# Patient Record
Sex: Male | Born: 1995 | Race: Black or African American | Hispanic: No | Marital: Single | Smoking: Never smoker
Health system: Southern US, Community
[De-identification: ages and names within clinical notes are randomized; demographics above are authoritative.]

## PROBLEM LIST (undated history)

## (undated) DIAGNOSIS — D573 Sickle-cell trait: Secondary | ICD-10-CM

## (undated) HISTORY — PX: OTHER SURGICAL HISTORY: SHX169

## (undated) HISTORY — DX: Sickle-cell trait: D57.3

---
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
## 2018-05-02 ENCOUNTER — Encounter (HOSPITAL_COMMUNITY): Payer: Self-pay | Admitting: Emergency Medicine

## 2018-05-02 ENCOUNTER — Ambulatory Visit (HOSPITAL_COMMUNITY)
Admission: EM | Admit: 2018-05-02 | Discharge: 2018-05-02 | Disposition: A | Discharge status: Home / Self Care | Payer: BLUE CROSS/BLUE SHIELD | Attending: Family Medicine | Admitting: Family Medicine

## 2018-05-02 ENCOUNTER — Other Ambulatory Visit: Payer: Self-pay

## 2018-05-02 DIAGNOSIS — L731 Pseudofolliculitis barbae: Secondary | ICD-10-CM | POA: Diagnosis not present

## 2018-05-02 NOTE — ED Triage Notes (Signed)
Pt. Stated, My job requires me to take off my facial hair and if I do I have bumps, You have to sign the paper to say I do not have to shave my facial hair.

## 2018-05-03 NOTE — ED Provider Notes (Signed)
[facility]ARE [facility]ENTER   [id number] 05/02/18 Arrival Time: 1931  ASSESSMENT & PLAN:  1. Pseudofolliculitis barbae    Work form signed. He may f/u as needed.  Reviewed expectations re: course of current medical issues. Questions answered. Outlined signs and symptoms indicating need for more acute intervention. Patient verbalized understanding. After Visit Summary given.   SUBJECTIVE:  Scott Barnett is a 22 y.o. male who presents with a skin complaint.   Location: beard; jawline Onset: gradual Duration: several weeks to months Pruritic? No Painful? Yes Progression: stable  Drainage? No  Known trigger? Relates worse when he shaves his beard close New soaps/lotions/topicals/detergents? No Environmental exposures or allergies? none Contacts with similar? No Recent travel? No  Other associated symptoms: none Therapies tried thus far: none Denies fever. No specific aggravating or alleviating factors reported.  H/O this before.  Here because he needs a work note from [employer] signed stating that he has PSB and cannot shave his beard.  ROS: As per HPI.  OBJECTIVE: Vitals:   05/02/18 2001 05/02/18 2002  BP: 110/64   Pulse: 60   Resp: 16   Temp: 99 F (37.2 C)   TempSrc: Oral   SpO2: 98%   Weight:  179 lb 6 oz (81.4 kg)  Height:   (1.88 m)    General appearance: alert; no distress Lungs: clear to auscultation bilaterally Heart: regular rate and rhythm Extremities: no edema Skin: warm and dry; flesh-coloured and red follicular papules under bilateral jawline; no bleeding or drainage Psychological: alert and cooperative; normal mood and affect  No Known Allergies  Past Medical History:  Diagnosis Date  . Sickle cell trait (HCC)    Social History   Socioeconomic History  . Marital status: Single    Spouse name: Not on file  . Number of children: Not on file  . Years of education: Not on file  . Highest education level: Not on file  Occupational History    . Not on file  Social Needs  . Financial resource strain: Not on file  . Food insecurity:    Worry: Not on file    Inability: Not on file  . Transportation needs:    Medical: Not on file    Non-medical: Not on file  Tobacco Use  . Smoking status: Never Smoker  . Smokeless tobacco: Never Used  Substance and Sexual Activity  . Alcohol use: No  . Drug use: No  . Sexual activity: Never    Birth control/protection: Abstinence  Lifestyle  . Physical activity:    Days per week: Not on file    Minutes per session: Not on file  . Stress: Not on file  Relationships  . Social connections:    Talks on phone: Not on file    Gets together: Not on file    Attends religious service: Not on file    Active member of club or organization: Not on file    Attends meetings of clubs or organizations: Not on file    Relationship status: Not on file  . Intimate partner violence:    Fear of current or ex partner: Not on file    Emotionally abused: Not on file    Physically abused: Not on file    Forced sexual activity: Not on file  Other Topics Concern  . Not on file  Social History Narrative   Marital status: single; not dating currently.      Lives: with grandparents, aunt.  Mom in apartment; Dad  in [place].  Moved from [place] 6 months ago.     Education:  11th grader at [employer]  Adjusting fine.  ABCs in Math; failed Math; no held back.  Favorite subject English; wants to be unknown.  Writes songs; wants to play football.  Track and football and maybe Lacrosse.  Punishment: minimal punishment.  No attention or behavior issues.  No driving; no learner's permit; interested in driving; +seatbelt 161%.  Cell phone use; some texting issues.  Goes to bed 3:00am; wakes up 7:45.  Fun on weekends write songs, hand out with friends.     Family History  Problem Relation Age of Onset  . Cerebral palsy Sister   . Developmental delay Brother   . Hypertension Mother   . Migraines Mother     Past Surgical History:  Procedure Laterality Date  . Full term infant C-section 9-8 lb    . TYMPANOSTOMY TUBE PLACEMENT  1999     Mardella Layman, MD 05/07/18 [phone]

## 2021-04-12 ENCOUNTER — Ambulatory Visit: Payer: Self-pay

## 2021-04-12 ENCOUNTER — Other Ambulatory Visit: Payer: Self-pay

## 2021-04-12 ENCOUNTER — Ambulatory Visit (INDEPENDENT_AMBULATORY_CARE_PROVIDER_SITE_OTHER): Payer: BC Managed Care – PPO

## 2021-04-12 ENCOUNTER — Ambulatory Visit
Admission: EM | Admit: 2021-04-12 | Discharge: 2021-04-12 | Disposition: A | Discharge status: Home / Self Care | Payer: BC Managed Care – PPO | Attending: Emergency Medicine | Admitting: Emergency Medicine

## 2021-04-12 DIAGNOSIS — X58XXXA Exposure to other specified factors, initial encounter: Secondary | ICD-10-CM | POA: Diagnosis not present

## 2021-04-12 DIAGNOSIS — M79645 Pain in left finger(s): Secondary | ICD-10-CM

## 2021-04-12 DIAGNOSIS — M26621 Arthralgia of right temporomandibular joint: Secondary | ICD-10-CM

## 2021-04-12 DIAGNOSIS — S6992XA Unspecified injury of left wrist, hand and finger(s), initial encounter: Secondary | ICD-10-CM | POA: Diagnosis not present

## 2021-04-12 MED ORDER — NAPROXEN 500 MG PO TABS: 500.0000 mg | ORAL_TABLET | Freq: Two times a day (BID) | ORAL | 1 refills | Status: AC | PRN

## 2021-04-12 NOTE — Discharge Instructions (Signed)
Your x-ray was normal today.  You do not have any fractures.  You likely sprained or strained your thumb.  I have sent an anti-inflammatory to the pharmacy.  You can also take Tylenol if you need to for pain.  Ice your thumb every couple of hours to help with the swelling.  We have given you a brace.  Try to keep your hand elevated.  If you are still having significant discomfort or limited range of motion of the thumb in the next 7 to 10 days, follow-up with [facility] walk-in urgent care in [facility].  You may need a repeat x-ray.  Your jaw pain is likely related to TMJ.  See the handout.  Try to avoid excessive chewing or grinding.  You may consider seeing your dentist in case your TMJ pain is related to clenching her jaw or grinding her teeth.  The anti-inflammatory should help and you can also take over-the-counter Tylenol for pain relief.

## 2021-04-12 NOTE — ED Triage Notes (Signed)
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  Swelling noted at base of L thumb.  Can make fist but unable to grip.  Denies numbness /tingling.  Ice was applied at home.  No meds taken at home.   Pt also concerned with R jaw pain that occurs with extensive chewing.  First noticed 1.5 weeks ago, describes as a tightness and pain.

## 2021-04-12 NOTE — ED Provider Notes (Signed)
[facility]    CSN: [id number] Arrival date & time: 04/12/21  1448      History   Chief Complaint Chief Complaint  Patient presents with  . Hand Pain    L DOI 04/24    HPI Scott Barnett is a 25 y.o. right-handed male presenting for multiple complaints.  First he states that he accidentally jammed his left thumb on a router yesterday.  He has had swelling of his thumb and pain since.  He also has difficulty moving the thumb due to pain.  He denies any numbness or tingling or weakness.  He has not applied ice or taken any medications over-the-counter for pain relief.  Additionally, he complains of right-sided jaw pain that comes and goes for the past week and a half.  He says that pain is worse whenever he chews and sometimes never he opens his mouth widely.  He says that this "tight and painful."  He denies any other abdominal pain or ear pain.  No sore throat.  Denies any injury he has no other complaints or concerns today.  HPI  Past Medical History:  Diagnosis Date  . Sickle cell trait (HCC)     There are no problems to display for this patient.   Past Surgical History:  Procedure Laterality Date  . Full term infant C-section 9-8 lb    . TYMPANOSTOMY TUBE PLACEMENT  1999       Home Medications    Prior to Admission medications   Medication Sig Start Date End Date Taking? Authorizing Provider  naproxen (NAPROSYN) 500 MG tablet Take 1 tablet (500 mg total) by mouth 2 (two) times daily as needed for up to 15 days. 04/12/21 04/27/21 Yes Shirlee Latch, PA-C    Family History Family History  Problem Relation Age of Onset  . Cerebral palsy Sister   . Developmental delay Brother   . Hypertension Mother   . Migraines Mother     Social History Social History   Tobacco Use  . Smoking status: Never Smoker  . Smokeless tobacco: Never Used  Vaping Use  . Vaping Use: Never used  Substance Use Topics  . Alcohol use: Yes    Comment: occasional   . Drug  use: No     Allergies   Patient has no known allergies.   Review of Systems Review of Systems  Constitutional: Negative for fatigue and fever.  HENT: Negative for dental problem, ear pain and facial swelling.   Musculoskeletal: Positive for arthralgias and joint swelling. Negative for myalgias.  Skin: Negative for color change, rash and wound.  Neurological: Negative for weakness.     Physical Exam Triage Vital Signs ED Triage Vitals  Enc Vitals Group     BP 04/12/21 1526 116/78     Pulse Rate 04/12/21 1526 82     Resp 04/12/21 1526 18     Temp 04/12/21 1526 99 F (37.2 C)     Temp Source 04/12/21 1526 Oral     SpO2 04/12/21 1526 96 %     Weight --      Height --      Head Circumference --      Peak Flow --      Pain Score 04/12/21 1523 3     Pain Loc --      Pain Edu? --      Excl. in GC? --    No data found.  Updated Vital Signs BP 116/78 (BP Location:  Left Arm)   Pulse 82   Temp 99 F (37.2 C) (Oral)   Resp 18   SpO2 96%       Physical Exam Vitals and nursing note reviewed.  Constitutional:      General: He is not in acute distress.    Appearance: Normal appearance. He is well-developed. He is not ill-appearing.  HENT:     Head: Normocephalic and atraumatic.     Comments: Mild TTP TMJ on right. No clicking or popping    Right Ear: Tympanic membrane, ear canal and external ear normal.     Nose: Nose normal.     Mouth/Throat:     Mouth: Mucous membranes are moist.     Pharynx: Oropharynx is clear.     Comments: Normal dentition. No abscesses or tenderness Eyes:     General: No scleral icterus.    Conjunctiva/sclera: Conjunctivae normal.  Cardiovascular:     Rate and Rhythm: Normal rate and regular rhythm.     Pulses: Normal pulses.  Pulmonary:     Effort: Pulmonary effort is normal. No respiratory distress.     Breath sounds: Normal breath sounds.  Musculoskeletal:     Left hand: Bony tenderness (TTP base of thumb, IP joint. No snuff box  tenderness) present. Decreased range of motion (decreased abduction and aduction as well as flexion of left thumb). Normal strength. Normal sensation. Normal pulse.     Cervical back: Neck supple.  Skin:    General: Skin is warm and dry.  Neurological:     General: No focal deficit present.     Mental Status: He is alert. Mental status is at baseline.     Motor: No weakness.     Gait: Gait normal.  Psychiatric:        Mood and Affect: Mood normal.        Behavior: Behavior normal.        Thought Content: Thought content normal.      UC Treatments / Results  Labs (all labs ordered are listed, but only abnormal results are displayed) Labs Reviewed - No data to display  EKG   Radiology DG Hand Complete Left  Result Date: 04/12/2021 CLINICAL DATA:  Blunt trauma.  LEFT base of thumb. EXAM: LEFT HAND - COMPLETE 3+ VIEW COMPARISON:  None. FINDINGS: No evidence of fracture of the carpal or metacarpal bones. Radiocarpal joint is intact. Phalanges are normal. No soft tissue injury. IMPRESSION: No fracture or dislocation. Electronically Signed   By: Genevive Bi M.D.   On: 04/12/2021 16:06    Procedures Procedures (including critical care time)  Medications Ordered in UC Medications - No data to display  Initial Impression / Assessment and Plan / UC Course  I have reviewed the triage vital signs and the nursing notes.  Pertinent labs & imaging results that were available during my care of the patient were reviewed by me and considered in my medical decision making (see chart for details).   25 year old male presenting for left thumb injury and also TMJ pain.  X-ray of left thumb obtained today which does not reveal any fractures.  Independent interpretation reviewed by me.  Denies any fractures.  Reviewed results to patient.  Patient given thumb spica and advised on conservative care.  I sent naproxen to pharmacy and advised he can also take Tylenol and ice the finger.  Advised  to follow-up with Ortho if not improving over the next week or if swelling and pain worsen.  Advised  patient that his TMJ pain should also improve with the naproxen I have sent.  Advised him to avoid any excessive chewing.  Advised following up with PCP or dentist if this continues over the next couple of weeks or does not improve.   Final Clinical Impressions(s) / UC Diagnoses   Final diagnoses:  Thumb pain, left  Injury of left thumb, initial encounter  Arthralgia of right temporomandibular joint     Discharge Instructions     Your x-ray was normal today.  You do not have any fractures.  You likely sprained or strained your thumb.  I have sent an anti-inflammatory to the pharmacy.  You can also take Tylenol if you need to for pain.  Ice your thumb every couple of hours to help with the swelling.  We have given you a brace.  Try to keep your hand elevated.  If you are still having significant discomfort or limited range of motion of the thumb in the next 7 to 10 days, follow-up with [facility] walk-in urgent care in [facility]  You may need a repeat x-ray.  Your jaw pain is likely related to TMJ.  See the handout.  Try to avoid excessive chewing or grinding.  You may consider seeing your dentist in case your TMJ pain is related to clenching her jaw or grinding her teeth.  The anti-inflammatory should help and you can also take over-the-counter Tylenol for pain relief.    ED Prescriptions    Medication Sig Dispense Auth. Provider   naproxen (NAPROSYN) 500 MG tablet Take 1 tablet (500 mg total) by mouth 2 (two) times daily as needed for up to 15 days. 30 tablet Scott Barnett     PDMP not reviewed this encounter.   Shirlee Latch, PA-C 04/12/21 1657

## 2021-04-16 ENCOUNTER — Telehealth: Payer: Self-pay | Admitting: Physician Assistant

## 2021-04-16 NOTE — Telephone Encounter (Signed)
Patient seen by me on 04/12/2021 for injury of the thumb.  Patient has been out of work since then.  He did have normal x-rays at that time but suspect a sprain of his thumb.  Patient treated with thumb spica and supportive care.  Work note has been provided for 4/25 through 4/30.  Advised patient if he cannot do his job then he needs to go to [facility] where he can have some restrictions placed on him and further work-up.

## 2021-10-12 IMAGING — CR DG HAND COMPLETE 3+V*L*
3 series · 3 of 3 positions shown · non-contrast
Comparison: None.

CLINICAL DATA: Blunt trauma.  LEFT base of thumb.

EXAM:
LEFT HAND - COMPLETE 3+ VIEW

[hand ap]
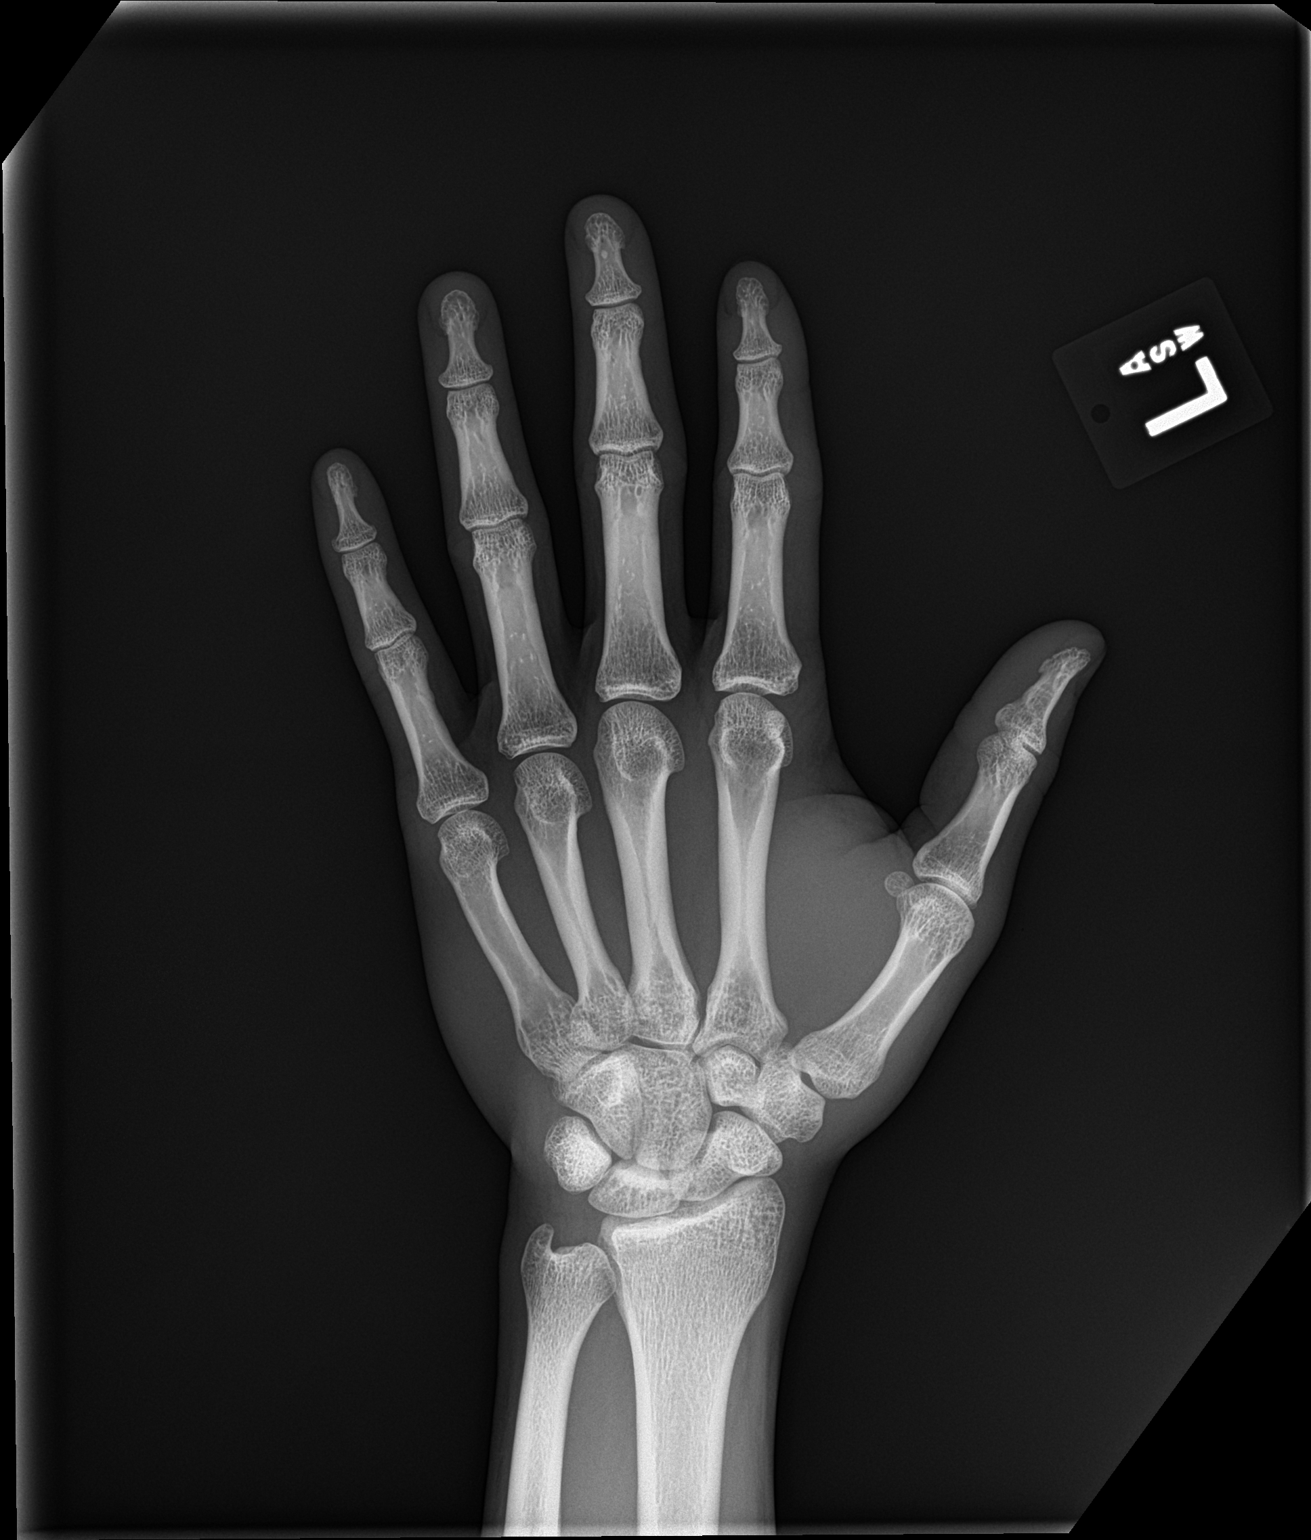

[hand obl]
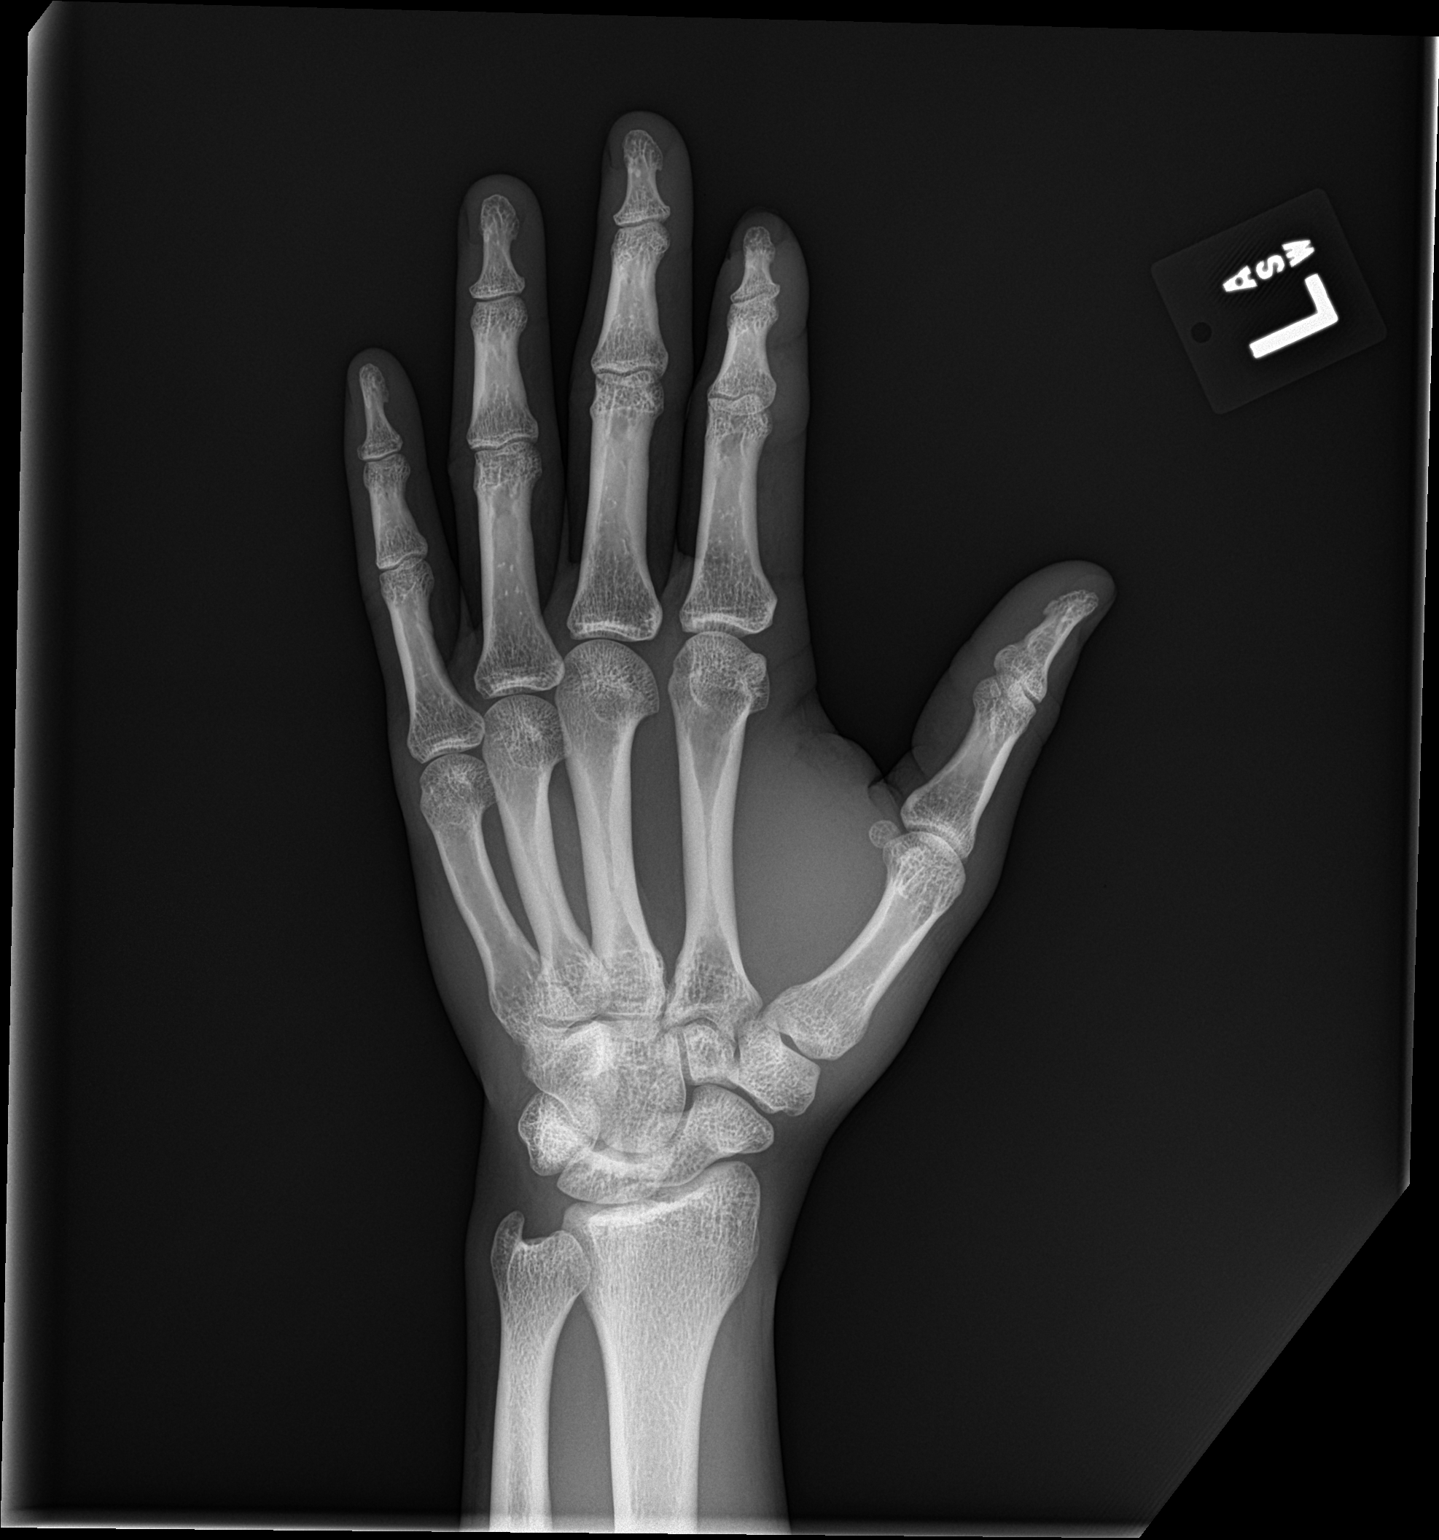

[hand lat]
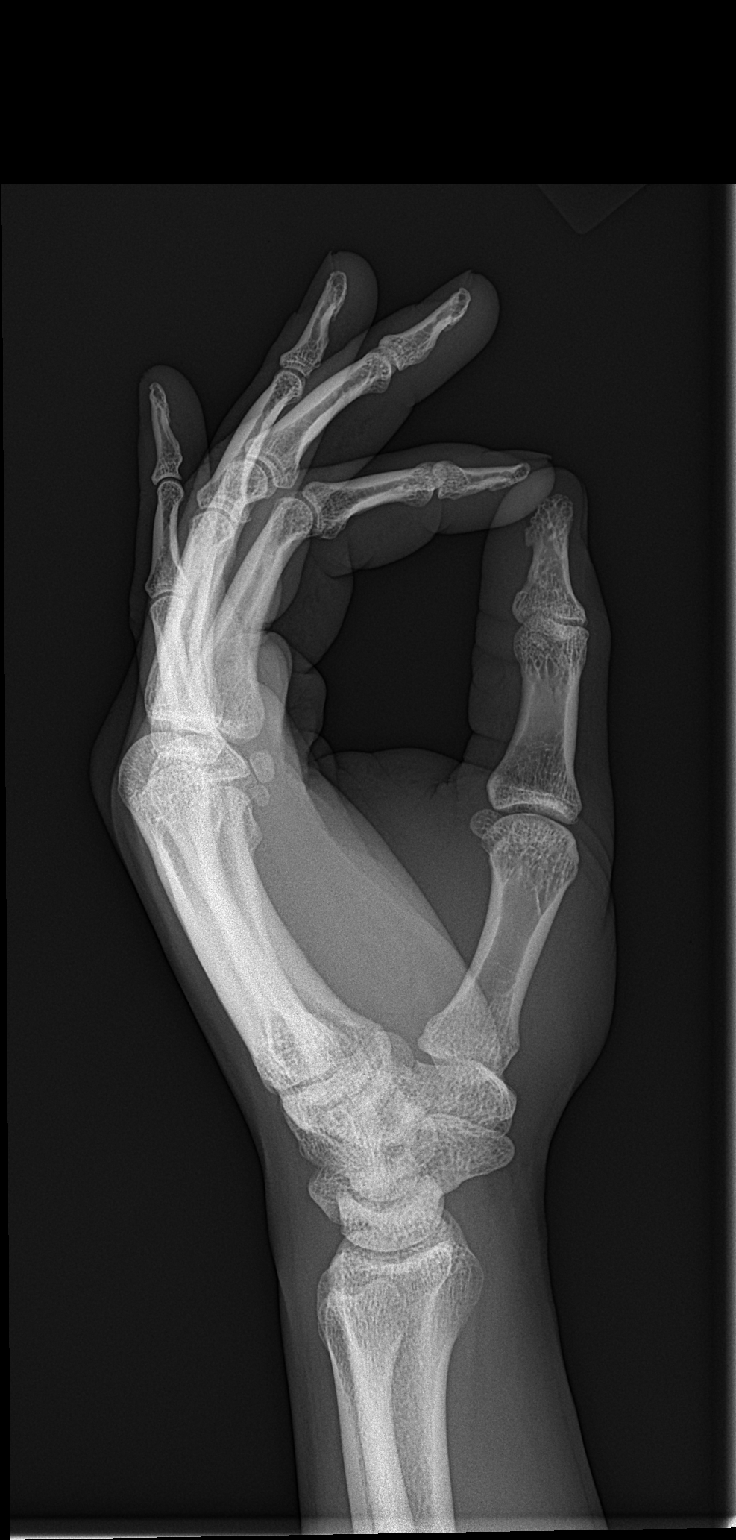

[3 of 3 positions shown; findings below may reference images not displayed]

FINDINGS: No evidence of fracture of the carpal or metacarpal bones.
Radiocarpal joint is intact. Phalanges are normal. No soft tissue
injury.
IMPRESSION: No fracture or dislocation.

## 2023-06-28 ENCOUNTER — Encounter (HOSPITAL_COMMUNITY): Payer: Self-pay | Admitting: Emergency Medicine

## 2023-06-28 ENCOUNTER — Other Ambulatory Visit: Payer: Self-pay

## 2023-06-28 ENCOUNTER — Emergency Department (HOSPITAL_COMMUNITY)
Admission: EM | Admit: 2023-06-28 | Discharge: 2023-06-28 | Disposition: A | Discharge status: Home / Self Care | Payer: BC Managed Care – PPO | Attending: Emergency Medicine | Admitting: Emergency Medicine

## 2023-06-28 DIAGNOSIS — J029 Acute pharyngitis, unspecified: Secondary | ICD-10-CM | POA: Diagnosis present

## 2023-06-28 LAB — GROUP A STREP BY PCR: Group A Strep by PCR: NOT DETECTED

## 2023-06-28 MED ORDER — DEXAMETHASONE 4 MG PO TABS
10.0000 mg | ORAL_TABLET | Freq: Once | ORAL | Status: AC
  Administered 2023-06-28: 10 mg via ORAL
  Filled 2023-06-28: qty 1

## 2023-06-28 NOTE — Discharge Instructions (Signed)
Try and keep yourself hydrated.  Motrin Tylenol help with the pain.  If symptoms do not improve may need further workup.

## 2023-06-28 NOTE — ED Provider Notes (Signed)
  [place] EMERGENCY DEPARTMENT AT [facility] Provider Note   CSN: [id number] Arrival date & time: 06/28/23  0720     History  Chief Complaint  Patient presents with   Sore Throat    Scott Barnett is a 27 y.o. male.   Sore Throat  Patient with sore throat.  Does have around 3 days.  Hurts with swallowing.  Began on the right but now both sides.  No fevers.  No nausea or vomiting.  States ears feel full which she has had issues with in the past.  Of note he says he also did give oral sex to a male.  No known infection on her. Does have history of sickle cell trait and states he wants to start working out more heavily.    Past Medical History:  Diagnosis Date   Sickle cell trait (HCC)     Home Medications Prior to Admission medications   Not on File      Allergies    Patient has no known allergies.    Review of Systems   Review of Systems  Physical Exam Updated Vital Signs BP (!) 152/73 (BP Location: Right Arm)   Pulse 91   Temp 98.3 F (36.8 C) (Oral)   Resp 17   Ht 6' (1.829 m)   Wt 85.3 kg   SpO2 98%   BMI 25.50 kg/m  Physical Exam Vitals reviewed.  HENT:     Head: Atraumatic.     Right Ear: Tympanic membrane normal.     Left Ear: Tympanic membrane normal.     Mouth/Throat:     Pharynx: Posterior oropharyngeal erythema present. No pharyngeal swelling or oropharyngeal exudate.  Cardiovascular:     Rate and Rhythm: Normal rate.  Abdominal:     Tenderness: There is no abdominal tenderness.  Neurological:     Mental Status: He is alert.     ED Results / Procedures / Treatments   Labs (all labs ordered are listed, but only abnormal results are displayed) Labs Reviewed  GROUP A STREP BY PCR    EKG None  Radiology No results found.  Procedures Procedures    Medications Ordered in ED Medications  dexamethasone (DECADRON) tablet 10 mg (10 mg Oral Given 06/28/23 7829)    ED Course/ Medical Decision Making/ A&P                              Medical Decision Making  Patient with sore throat.  Erythema.  No exudate.  Will check strep test.  STD felt less likely but may need to be followed if symptoms not improved.  Also patient is muscular and states he is going to work out more.  says he had heard about blood work to evaluate his such as "biomarkers".  Patient is overall young and healthy although he does have sickle cell trait.  Do not think we need any specific blood work at this time.  Recommended to keep himself hydrated however.  Strep test negative.  Well-appearing.  Will discharge home.        Final Clinical Impression(s) / ED Diagnoses Final diagnoses:  Pharyngitis, unspecified etiology    Rx / DC Orders ED Discharge Orders     None         Benjiman Core, MD 06/28/23 [phone]

## 2023-06-28 NOTE — ED Triage Notes (Signed)
Pt reports hot pressure that builds up in his ears intermittently, and sore throat x3 days. Also requesting referral for blood work to start body building.

## 2023-09-11 ENCOUNTER — Ambulatory Visit
Admission: EM | Admit: 2023-09-11 | Discharge: 2023-09-11 | Disposition: A | Discharge status: Home / Self Care | Payer: BC Managed Care – PPO

## 2023-09-11 DIAGNOSIS — R052 Subacute cough: Secondary | ICD-10-CM | POA: Insufficient documentation

## 2023-09-11 DIAGNOSIS — J029 Acute pharyngitis, unspecified: Secondary | ICD-10-CM | POA: Diagnosis not present

## 2023-09-11 LAB — POCT RAPID STREP A (OFFICE): Rapid Strep A Screen: NEGATIVE

## 2023-09-11 MED ORDER — AMOXICILLIN-POT CLAVULANATE 875-125 MG PO TABS: 1.0000 | ORAL_TABLET | Freq: Two times a day (BID) | ORAL | 0 refills | Status: DC

## 2023-09-11 NOTE — ED Provider Notes (Signed)
[facility]    CSN: [id number] Arrival date & time: 09/11/23  1336      History   Chief Complaint Chief Complaint  Patient presents with   Sore Throat    HPI Scott Barnett is a 27 y.o. male.   Patient presents today with a sore throat and hoarse voice that she has had for the last week.  She reports that she has not any fever but has had some cough.  She denies any runny nose.  She denies rashes.  She has taken over-the-counter medication without resolution.  The history is provided by the patient.  Sore Throat Pertinent negatives include no shortness of breath.    Past Medical History:  Diagnosis Date   Sickle cell trait (HCC)     There are no problems to display for this patient.   Past Surgical History:  Procedure Laterality Date   Full term infant C-section 9-8 lb     TYMPANOSTOMY TUBE PLACEMENT  1999       Home Medications    Prior to Admission medications   Medication Sig Start Date End Date Taking? Authorizing Provider  amoxicillin-clavulanate (AUGMENTIN) 875-125 MG tablet Take 1 tablet by mouth every 12 (twelve) hours. 09/11/23  Yes Tomi Bamberger, PA-C  Dextromethorphan-guaiFENesin (ROBITUSSIN DM PO) Take by mouth.   Yes [provider]  Pseudoeph-Doxylamine-DM-APAP (NYQUIL PO) Take by mouth.   Yes [provider]    Family History Family History  Problem Relation Age of Onset   Cerebral palsy Sister    Developmental delay Brother    Hypertension Mother    Migraines Mother     Social History Social History   Tobacco Use   Smoking status: Never   Smokeless tobacco: Never  Vaping Use   Vaping status: Never Used  Substance Use Topics   Alcohol use: Yes    Comment: occasional    Drug use: No     Allergies   Patient has no known allergies.   Review of Systems Review of Systems  Constitutional:  Negative for chills and fever.  HENT:  Positive for sore throat and voice change. Negative for congestion,  ear pain and rhinorrhea.   Eyes:  Negative for discharge and redness.  Respiratory:  Negative for cough and shortness of breath.   Gastrointestinal:  Negative for diarrhea and vomiting.  Neurological:  Negative for numbness.     Physical Exam Triage Vital Signs ED Triage Vitals  Encounter Vitals Group     BP 09/11/23 1403 112/76     Systolic BP Percentile --      Diastolic BP Percentile --      Pulse Rate 09/11/23 1403 94     Resp 09/11/23 1403 16     Temp 09/11/23 1403 99.1 F (37.3 C)     Temp Source 09/11/23 1403 Oral     SpO2 09/11/23 1403 99 %     Weight 09/11/23 1401 190 lb (86.2 kg)     Height 09/11/23 1401 6' (1.829 m)     Head Circumference --      Peak Flow --      Pain Score 09/11/23 1359 6     Pain Loc --      Pain Education --      Exclude from Growth Chart --    No data found.  Updated Vital Signs BP 112/76 (BP Location: Left Arm)   Pulse 94   Temp 99.1 F (37.3 C) (Oral)  Resp 16   Ht 6' (1.829 m)   Wt 190 lb (86.2 kg)   SpO2 99%   BMI 25.77 kg/m      Physical Exam Vitals and nursing note reviewed.  Constitutional:      General: He is not in acute distress.    Appearance: Normal appearance. He is not ill-appearing.  HENT:     Head: Normocephalic and atraumatic.     Nose: Nose normal. No congestion or rhinorrhea.     Mouth/Throat:     Mouth: Mucous membranes are moist.     Pharynx: Oropharynx is clear.     Comments: Hoarse voice noted Eyes:     Conjunctiva/sclera: Conjunctivae normal.  Cardiovascular:     Rate and Rhythm: Normal rate.  Pulmonary:     Effort: Pulmonary effort is normal. No respiratory distress.  Neurological:     Mental Status: He is alert.  Psychiatric:        Mood and Affect: Mood normal.        Behavior: Behavior normal.        Thought Content: Thought content normal.      UC Treatments / Results  Labs (all labs ordered are listed, but only abnormal results are displayed) Labs Reviewed  CULTURE, GROUP A  STREP [facility])  POCT RAPID STREP A (OFFICE)    EKG   Radiology No results found.  Procedures Procedures (including critical care time)  Medications Ordered in UC Medications - No data to display  Initial Impression / Assessment and Plan / UC Course  I have reviewed the triage vital signs and the nursing notes.  Pertinent labs & imaging results that were available during my care of the patient were reviewed by me and considered in my medical decision making (see chart for details).    Augmentin prescribed given duration of symptoms to cover possible sinusitis or other secondary bacterial infection.  Strep screening negative in office.  Encouraged symptomatic treatment, increase fluids and rest.  Recommend follow-up if no gradual improvement with any further concerns.  Final Clinical Impressions(s) / UC Diagnoses   Final diagnoses:  Acute pharyngitis, unspecified etiology  Subacute cough   Discharge Instructions   None    ED Prescriptions     Medication Sig Dispense Auth. Provider   amoxicillin-clavulanate (AUGMENTIN) 875-125 MG tablet Take 1 tablet by mouth every 12 (twelve) hours. 14 tablet Tomi Bamberger, PA-C      PDMP not reviewed this encounter.   Tomi Bamberger, PA-C 09/16/23 0830

## 2023-09-11 NOTE — ED Notes (Signed)
PCP has been chosen, scheduled via MyChart. (per request).

## 2023-09-11 NOTE — ED Triage Notes (Signed)
"  I have a sore throat with a hoarse voice that started on Saturday". No fever. No cough. No runny nose. No new/unexplained rash.

## 2023-09-14 LAB — CULTURE, GROUP A STREP (THRC)

## 2023-09-16 ENCOUNTER — Encounter: Payer: Self-pay | Admitting: Physician Assistant

## 2025-01-08 ENCOUNTER — Ambulatory Visit

## 2025-01-08 ENCOUNTER — Ambulatory Visit
Admission: RE | Admit: 2025-01-08 | Discharge: 2025-01-08 | Disposition: A | Discharge status: Home / Self Care | Source: Ambulatory Visit | Attending: Physician Assistant | Admitting: Physician Assistant

## 2025-01-08 VITALS — BP 110/62 | HR 61 | Temp 98.1°F | Resp 18 | Wt 193.8 lb

## 2025-01-08 DIAGNOSIS — G8929 Other chronic pain: Secondary | ICD-10-CM

## 2025-01-08 DIAGNOSIS — M542 Cervicalgia: Secondary | ICD-10-CM

## 2025-01-08 DIAGNOSIS — R202 Paresthesia of skin: Secondary | ICD-10-CM

## 2025-01-08 DIAGNOSIS — S161XXA Strain of muscle, fascia and tendon at neck level, initial encounter: Secondary | ICD-10-CM

## 2025-01-08 MED ORDER — BACLOFEN 10 MG PO TABS: 10.0000 mg | ORAL_TABLET | Freq: Three times a day (TID) | ORAL | 0 refills | Status: AC | PRN

## 2025-01-08 MED ORDER — NAPROXEN 500 MG PO TABS: 500.0000 mg | ORAL_TABLET | Freq: Two times a day (BID) | ORAL | 1 refills | Status: AC | PRN

## 2025-01-08 NOTE — ED Triage Notes (Signed)
 Pt present neck pain with numbness and tingling, since October.  Pt state the pain has been a come and go issue with numbness in foreman and the pain in his neck became unbearable.

## 2025-01-08 NOTE — ED Provider Notes (Signed)
 " Scott Barnett    CSN: [id number] Arrival date & time: 01/08/25  1221      History   Chief Complaint Chief Complaint  Patient presents with   Neck Injury    Numbness/ tingling in forearm and toe. Pain in neck and back - Entered by patient    HPI [name] is a 29 y.o. male presenting for ~3 month history of intermittent atraumatic neck pain. Over the past 1 week he has started to have intermittent left forearm pain and tingling in left upper extremity. Denies fall/trauma or injury.   Patient says pain has been persistent and now has become unbearable. Pain worse with extension and rotation of neck.   Patient says he works out hard with a new person last week and since then symptoms have been worse.  He also reports tingling intermittently in a toe of the left leg but no associated back pain or lower extremity pain.  No extremity weakness or loss of bowel/bladder control.   He has been using IcyHot but not taking any oral medications for his symptoms.  History of sickle cell trait.  HPI  Past Medical History:  Diagnosis Date   Sickle cell trait     There are no active problems to display for this patient.   Past Surgical History:  Procedure Laterality Date   Full term infant C-section 9-8 lb     TYMPANOSTOMY TUBE PLACEMENT  1999       Home Medications    Prior to Admission medications  Medication Sig Start Date End Date Taking? Authorizing Provider  baclofen  (LIORESAL ) 10 MG tablet Take 1 tablet (10 mg total) by mouth 3 (three) times daily as needed for muscle spasms. 01/08/25  Yes Arvis Huxley B, PA-C  naproxen  (NAPROSYN ) 500 MG tablet Take 1 tablet (500 mg total) by mouth 2 (two) times daily as needed for moderate pain (pain score 4-6). 01/08/25  Yes Arvis Huxley NOVAK, PA-C    Family History Family History  Problem Relation Age of Onset   Cerebral palsy Sister    Developmental delay Brother    Hypertension Mother    Migraines Mother      Social History Social History[1]   Allergies   Patient has no known allergies.   Review of Systems Review of Systems  Constitutional:  Negative for fatigue.  Eyes:  Negative for photophobia and visual disturbance.  Respiratory:  Negative for shortness of breath.   Cardiovascular:  Negative for chest pain and palpitations.  Gastrointestinal:  Negative for abdominal pain, nausea and vomiting.  Musculoskeletal:  Positive for neck pain and neck stiffness. Negative for arthralgias, back pain, gait problem and joint swelling.  Neurological:  Positive for numbness. Negative for dizziness, weakness and headaches.     Physical Exam Triage Vital Signs ED Triage Vitals  Encounter Vitals Group     BP      Girls Systolic BP Percentile      Girls Diastolic BP Percentile      Boys Systolic BP Percentile      Boys Diastolic BP Percentile      Pulse      Resp      Temp      Temp src      SpO2      Weight      Height      Head Circumference      Peak Flow      Pain Score      Pain  Loc      Pain Education      Exclude from Growth Chart    No data found.  Updated Vital Signs BP 110/62 (BP Location: Left Arm)   Pulse 61   Temp 98.1 F (36.7 C) (Oral)   Resp 18   Wt 193 lb 12.8 oz (87.9 kg)   SpO2 97%   BMI 26.28 kg/m     Physical Exam Vitals and nursing note reviewed.  Constitutional:      General: He is not in acute distress.    Appearance: Normal appearance. He is well-developed. He is not ill-appearing.  HENT:     Head: Normocephalic and atraumatic.  Eyes:     General: No scleral icterus.    Extraocular Movements: Extraocular movements intact.     Conjunctiva/sclera: Conjunctivae normal.     Pupils: Pupils are equal, round, and reactive to light.  Cardiovascular:     Rate and Rhythm: Normal rate and regular rhythm.  Pulmonary:     Effort: Pulmonary effort is normal. No respiratory distress.     Breath sounds: Normal breath sounds.  Musculoskeletal:      Cervical back: Neck supple.  Skin:    General: Skin is warm and dry.     Capillary Refill: Capillary refill takes less than 2 seconds.  Neurological:     General: No focal deficit present.     Mental Status: He is alert and oriented to person, place, and time. Mental status is at baseline.     Cranial Nerves: No cranial nerve deficit.     Motor: No weakness.     Coordination: Coordination normal.     Gait: Gait normal.  Psychiatric:        Mood and Affect: Mood normal.      UC Treatments / Results  Labs (all labs ordered are listed, but only abnormal results are displayed) Labs Reviewed - No data to display  EKG   Radiology DG Cervical Spine Complete Result Date: 01/08/2025 CLINICAL DATA:  Neck pain for 3 months EXAM: CERVICAL SPINE - COMPLETE 4+ VIEW COMPARISON:  None Available. FINDINGS: There is no evidence of cervical spine fracture or prevertebral soft tissue swelling. Alignment is normal. No other significant bone abnormalities are identified. IMPRESSION: Negative cervical spine radiographs. Electronically Signed   By: Lynwood Landy Raddle M.D.   On: 01/08/2025 13:24    Procedures Procedures (including critical Barnett time)  Medications Ordered in UC Medications - No data to display  Initial Impression / Assessment and Plan / UC Course  I have reviewed the triage vital signs and the nursing notes.  Pertinent labs & imaging results that were available during my Barnett of the patient were reviewed by me and considered in my medical decision making (see chart for details).   29 y/o male presents for 3 month history of intermittent neck pain. 1 week history of left upper extremity pain and numbness/tingling. No injuries. Patient does frequently lift weights. Not taking any oral meds.  X-ray of c-spine obtained. Negative.   Sent Naproxen  and baclofen . Advised heat/ice, stretching.  Advised PT and orthopedic follow up and discussed following up with [facility] or [facility]  clinic. Explained he needs MRI of neck and further treatment by specialist. Reviewed going to ED if acute worsening of pain, weakness in extremities, loss of bowel/bladder control, etc.    Final Clinical Impressions(s) / UC Diagnoses   Final diagnoses:  Chronic neck pain  Paresthesia of left arm  Strain of  neck muscle, initial encounter     Discharge Instructions      NECK PAIN: Stressed avoiding painful activities. This can exacerbate your symptoms and make them worse.  May apply heat to the areas of pain for some relief. Use medications as directed. Be aware of which medications make you drowsy and do not drive or operate any kind of heavy machinery while using the medication (ie pain medications or muscle relaxers). F/U with orthopedics. You need an MRI.  NECK PAIN RED FLAGS: If symptoms get worse than they are right now, you should come back sooner for re-evaluation. If you have increased numbness/ tingling or notice that the numbness/tingling is affecting the legs or saddle region, go to ER. If you ever lose continence go to ER.      You have a condition requiring you to follow up with Orthopedics so please call one of the following office for appointment:   [facility] Address: [address], [place], [place] [address] Phone: [phone]  [facility] [address], [place], [place] [address] Phone: [phone]  [facility] [address], [place], [place] [address] Phone: [phone]   [facility] [address] [place], [place] [address] Phone: [phone]     ED Prescriptions     Medication Sig Dispense Auth. Provider   naproxen  (NAPROSYN ) 500 MG tablet Take 1 tablet (500 mg total) by mouth 2 (two) times daily as needed for moderate pain (pain score 4-6). 30 tablet Arvis Huxley B, PA-C   baclofen  (LIORESAL ) 10 MG tablet Take 1 tablet (10 mg total) by mouth 3 (three) times daily as needed for muscle spasms. 30 each Arvis Huxley NOVAK, PA-C      I have  reviewed the PDMP during this encounter.     [1]  Social History Tobacco Use   Smoking status: Never   Smokeless tobacco: Never  Vaping Use   Vaping status: Never Used  Substance Use Topics   Alcohol use: Yes    Comment: occasional    Drug use: No     Arvis Huxley NOVAK, PA-C 01/08/25 1401  "

## 2025-01-08 NOTE — Discharge Instructions (Addendum)
 NECK PAIN: Stressed avoiding painful activities. This can exacerbate your symptoms and make them worse.  May apply heat to the areas of pain for some relief. Use medications as directed. Be aware of which medications make you drowsy and do not drive or operate any kind of heavy machinery while using the medication (ie pain medications or muscle relaxers). F/U with orthopedics. You need an MRI.  NECK PAIN RED FLAGS: If symptoms get worse than they are right now, you should come back sooner for re-evaluation. If you have increased numbness/ tingling or notice that the numbness/tingling is affecting the legs or saddle region, go to ER. If you ever lose continence go to ER.      You have a condition requiring you to follow up with Orthopedics so please call one of the following office for appointment:   [facility] Address: [address] [place], [place] [address] Phone: [phone]  [facility] [address] [place], [place] [address] Phone: [phone]  [facility] [address] [place], [place] [address] Phone: [phone]   [facility] [address] [place], [place] [address] Phone: [phone]
